# Patient Record
Sex: Female | Born: 1995 | Race: Black or African American | Hispanic: No | Marital: Single
Health system: Midwestern US, Academic
[De-identification: ages and names within clinical notes are randomized; demographics above are authoritative.]

## PROBLEM LIST (undated history)

## (undated) DIAGNOSIS — E282 Polycystic ovarian syndrome: Secondary | ICD-10-CM

## (undated) DIAGNOSIS — E119 Type 2 diabetes mellitus without complications: Secondary | ICD-10-CM

## (undated) DIAGNOSIS — E669 Obesity, unspecified: Secondary | ICD-10-CM

## (undated) DIAGNOSIS — E079 Disorder of thyroid, unspecified: Secondary | ICD-10-CM

---
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
## 2014-06-17 IMAGING — CR DG CERVICAL SPINE COMPLETE 4+V
5 series · 5 of 5 positions shown · non-contrast
Comparison: None.

CLINICAL DATA: Status post fall; neck pain.

CERVICAL SPINE - COMPLETE 4+ VIEW

[w cervical spine lat]
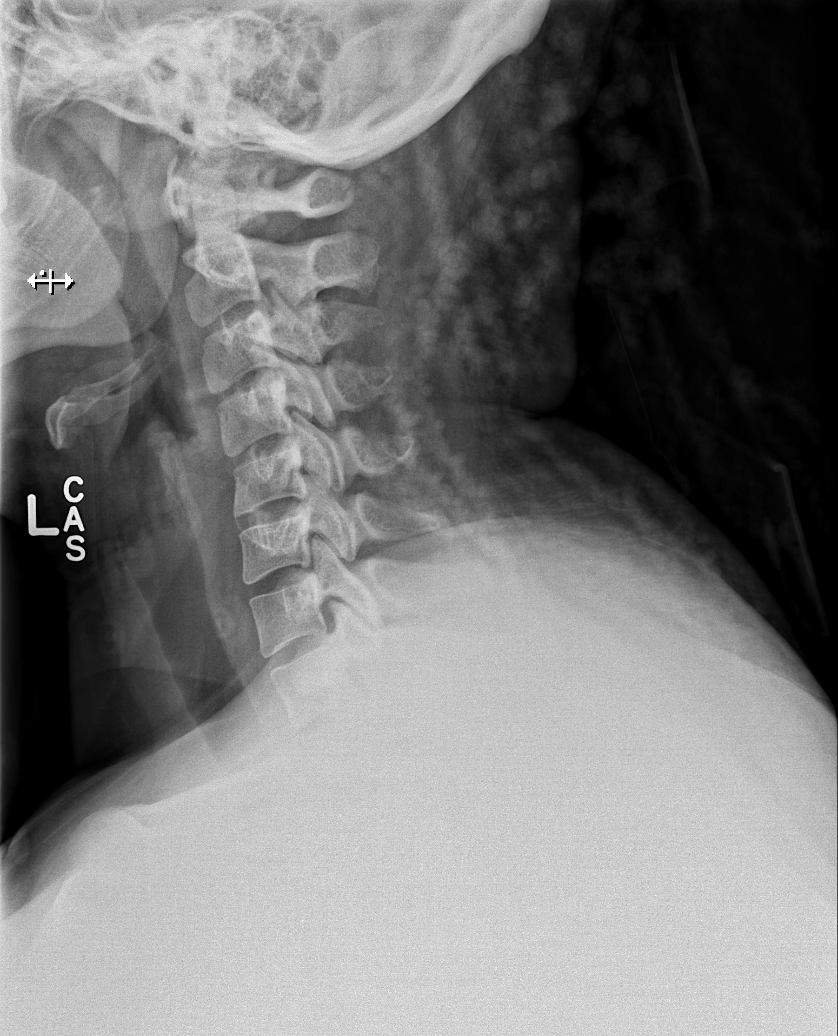

[w cervical spine ap_obl (1 of 2)]
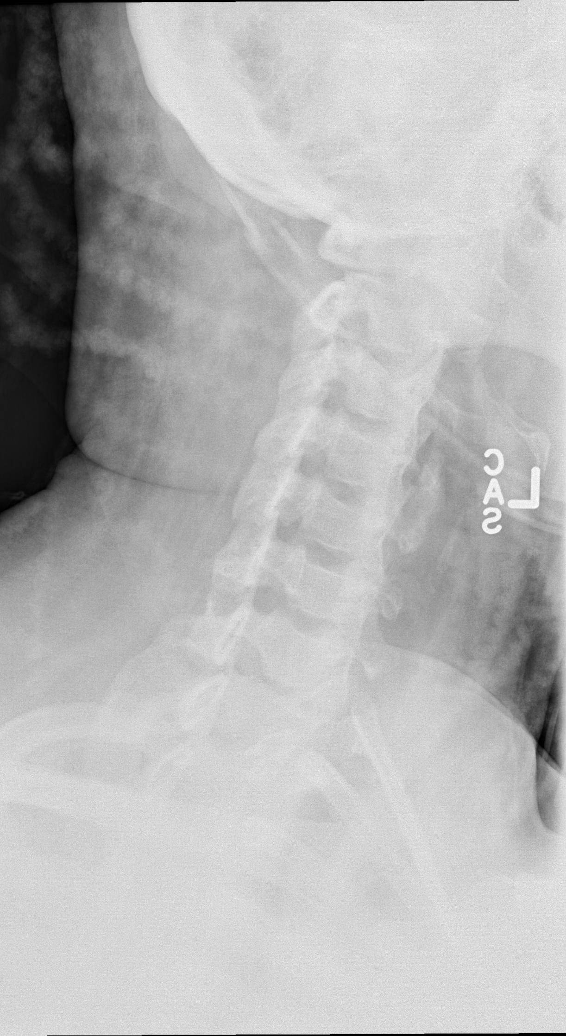

[w cervical spine ap_obl (2 of 2)]
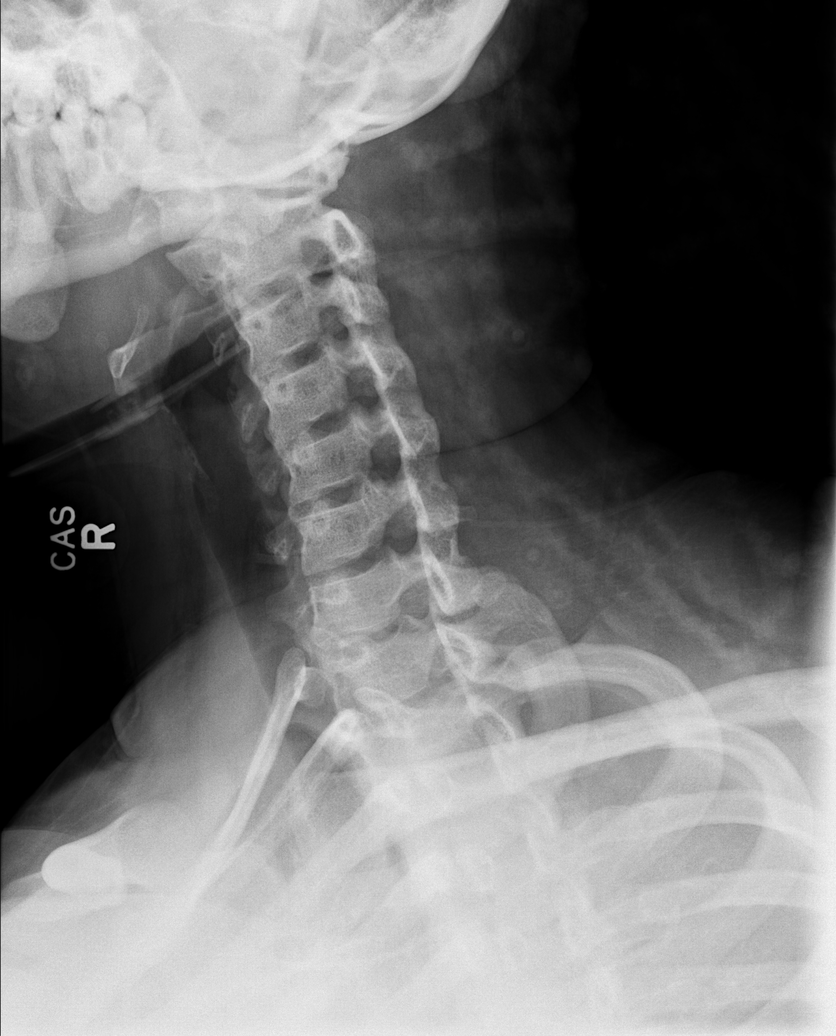

[w cervical spine ap]
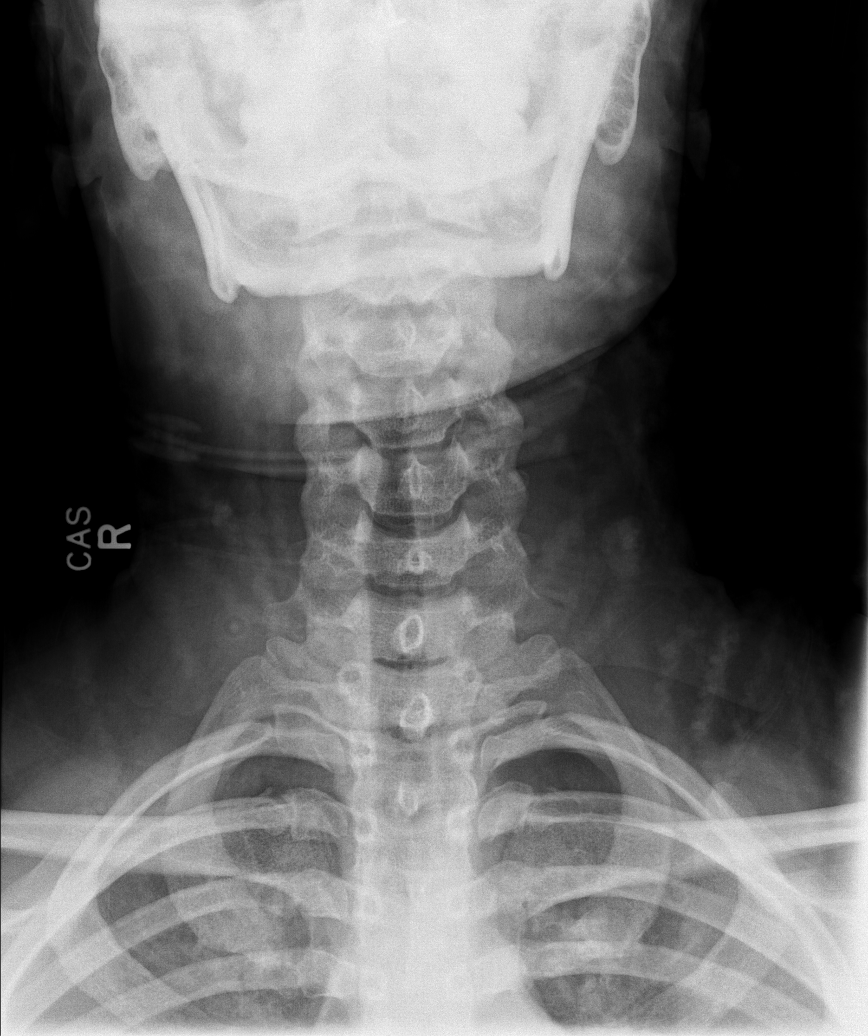

[w cervical swimmers]
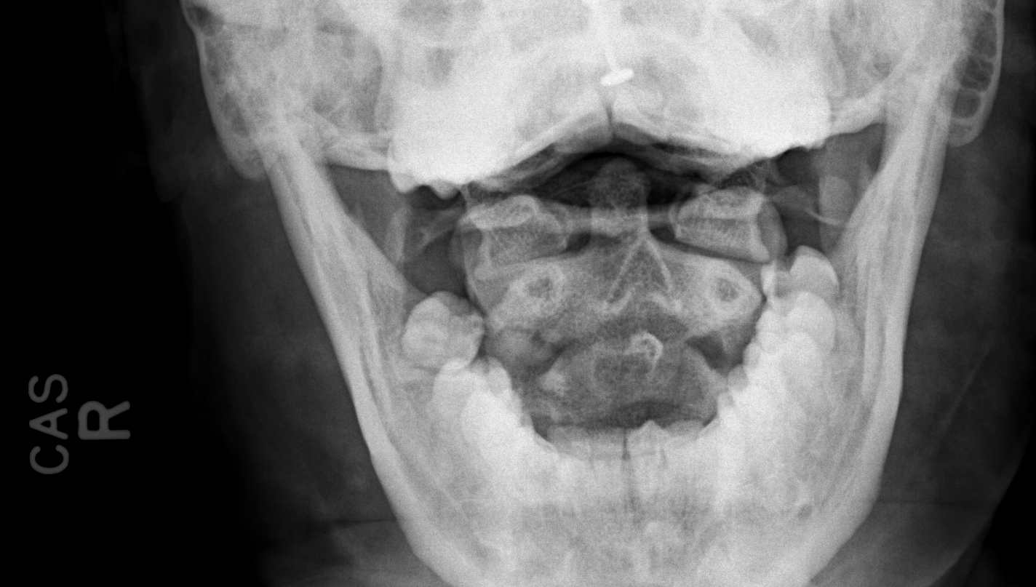

[5 of 5 positions shown; findings below may reference images not displayed]

FINDINGS: There is no evidence of fracture or subluxation.
Vertebral bodies demonstrate normal height and alignment.
Intervertebral disc spaces are preserved.  Prevertebral soft
tissues are within normal limits.  The provided odontoid view
demonstrates no significant abnormality.

The visualized lung apices are clear.
IMPRESSION: No evidence of fracture or subluxation along the cervical spine.

## 2015-07-22 IMAGING — CT CT HEAD W/O CM
2 series · 16 of 30 positions shown, 18 images · non-contrast
Comparison: None.

CLINICAL DATA: Syncope and fall yesterday. Woke up on floor.
Headache.

EXAM:
CT HEAD WITHOUT CONTRAST
TECHNIQUE: Contiguous axial images were obtained from the base of the skull
through the vertex without intravenous contrast.

[Series 201: head w/o, idose (1) · axial · non-contrast · 0.49mm/px · z∈[+63,+183]mm · 8 of 32 slices shown, 10 images]
[im 4/32  brain]
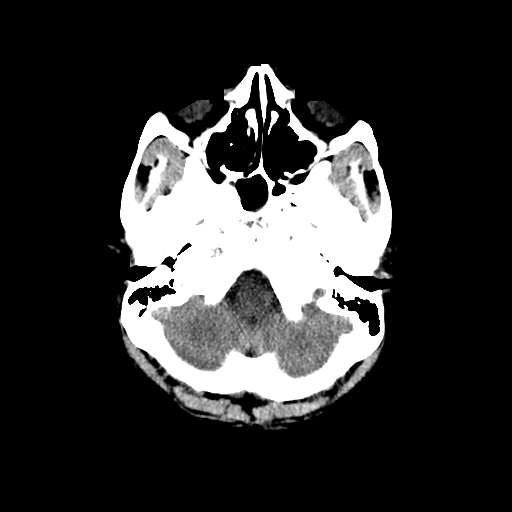
[im 4/32  bone]
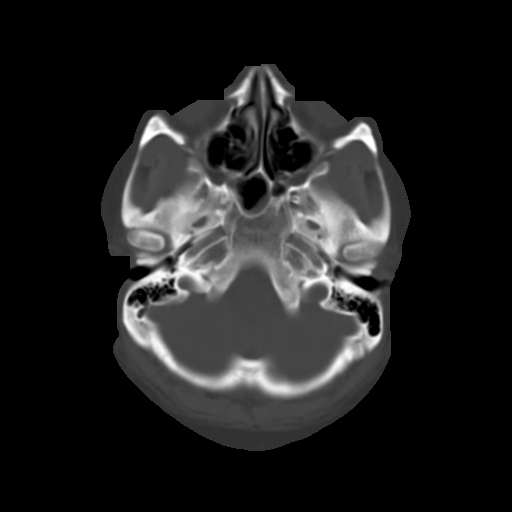
[im 7/32  brain]
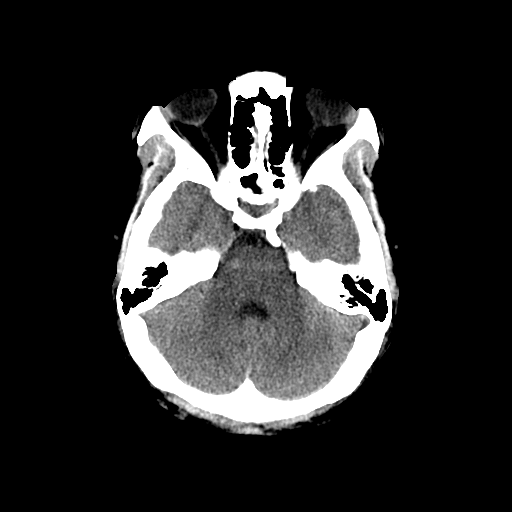
[im 11/32  brain]
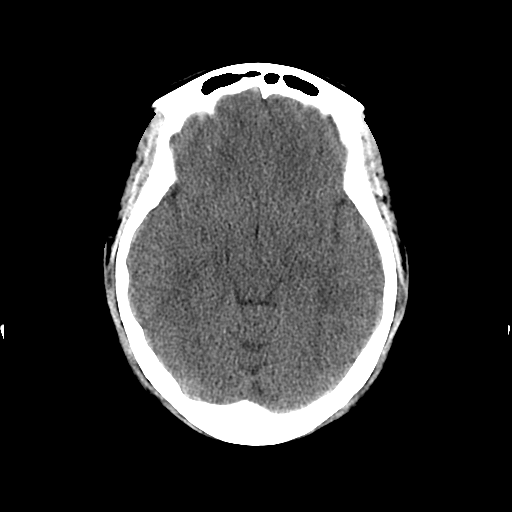
[im 14/32  brain]
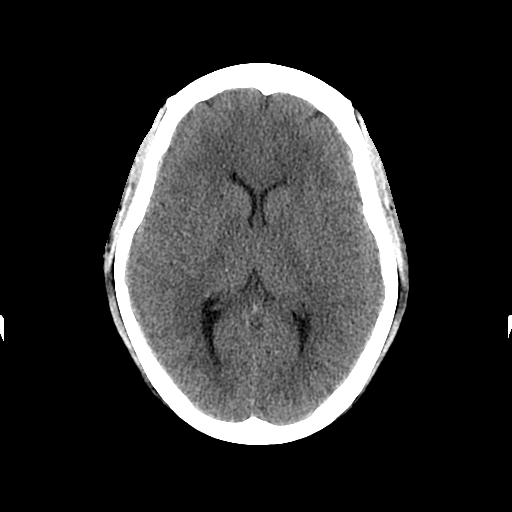
[im 18/32  brain]
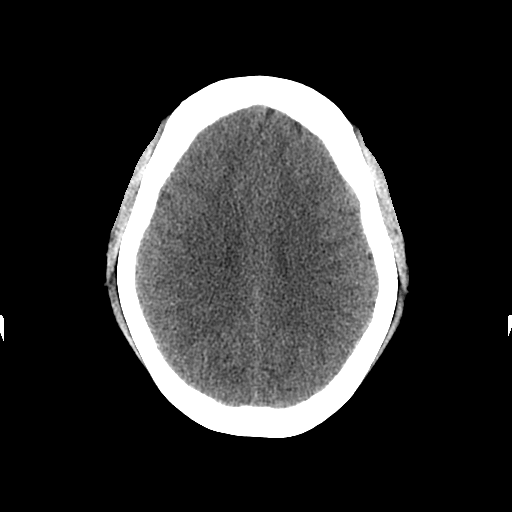
[im 18/32  bone]
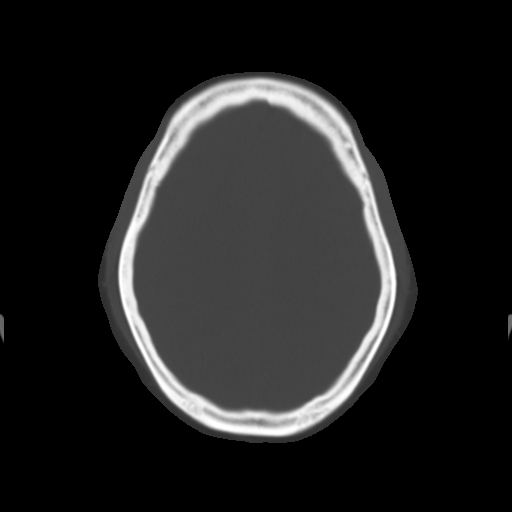
[im 21/32  brain]
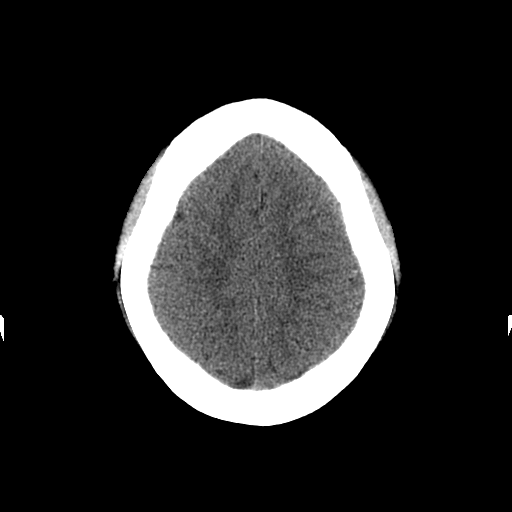
[im 25/32  brain]
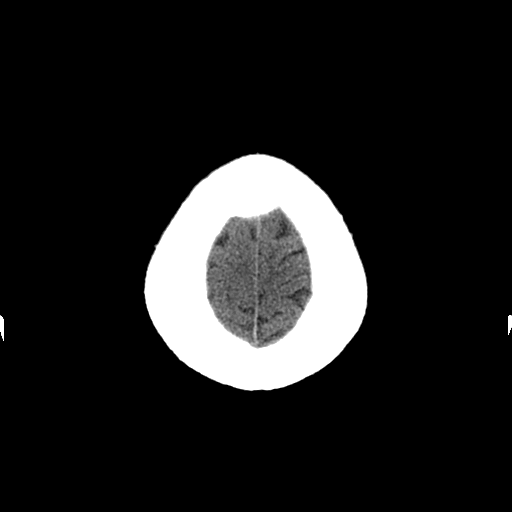
[im 28/32  brain]
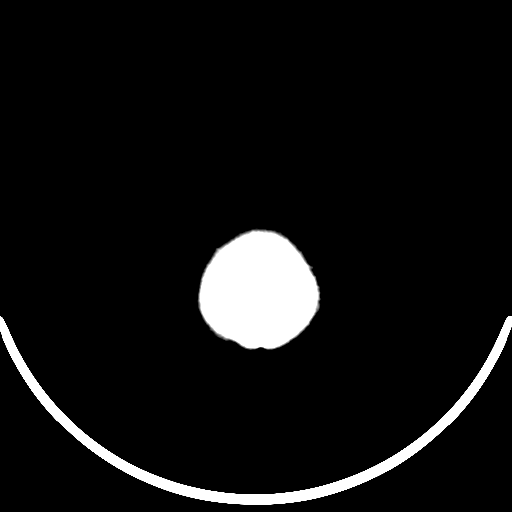

[Series 202: head w/o bone, idose (1) · axial · non-contrast · 0.49mm/px · z∈[+61,+186]mm · 8 of 64 slices shown]
[im 7/64  bone]
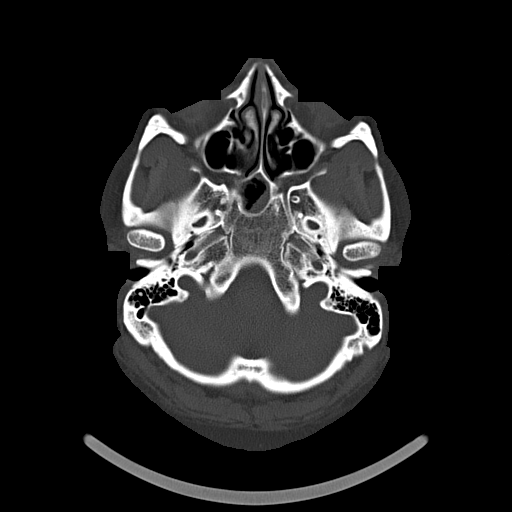
[im 14/64  bone]
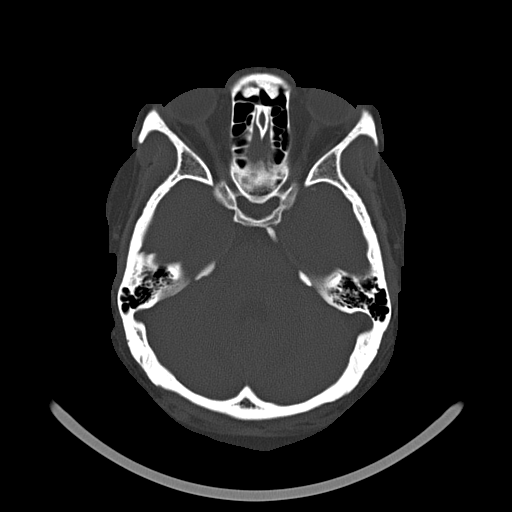
[im 20/64  bone]
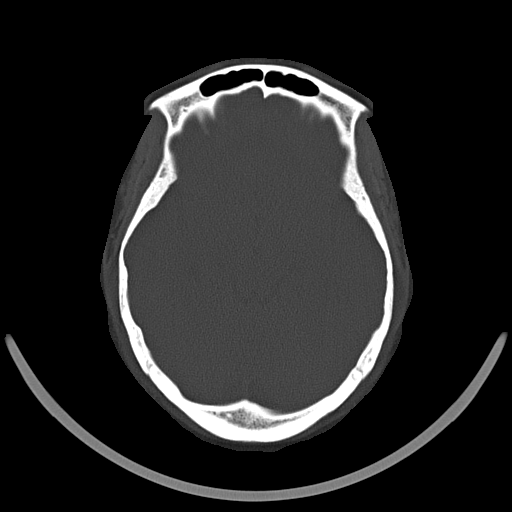
[im 27/64  bone]
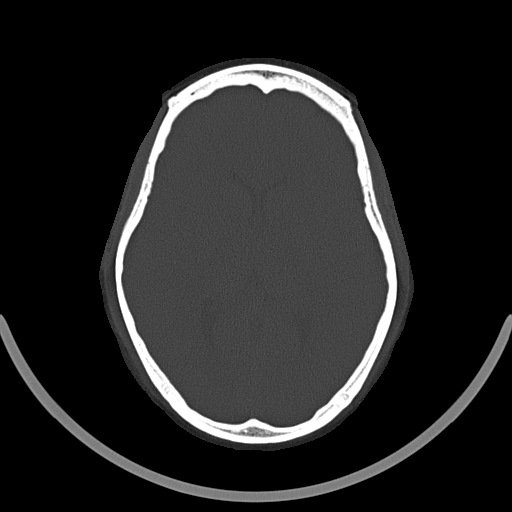
[im 37/64  bone]
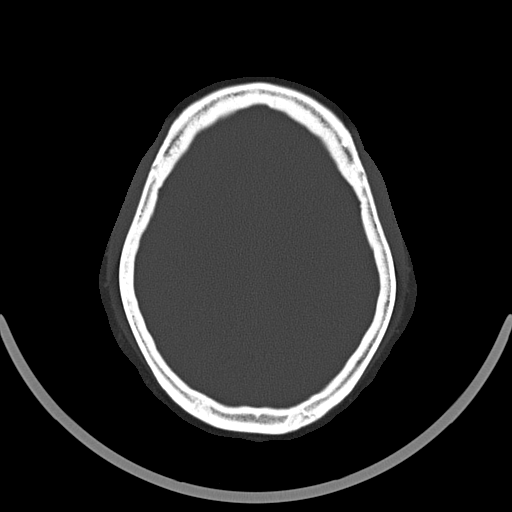
[im 44/64  bone]
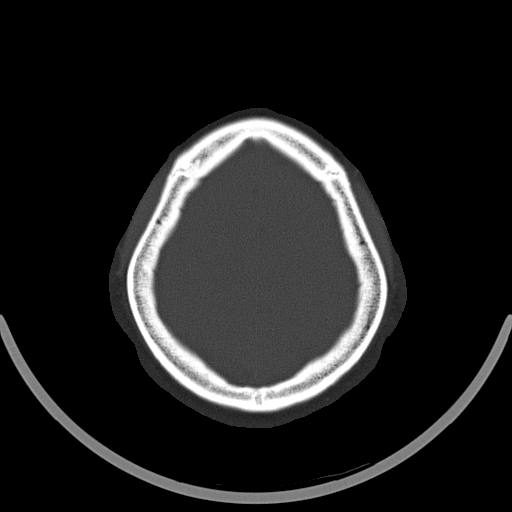
[im 50/64  bone]
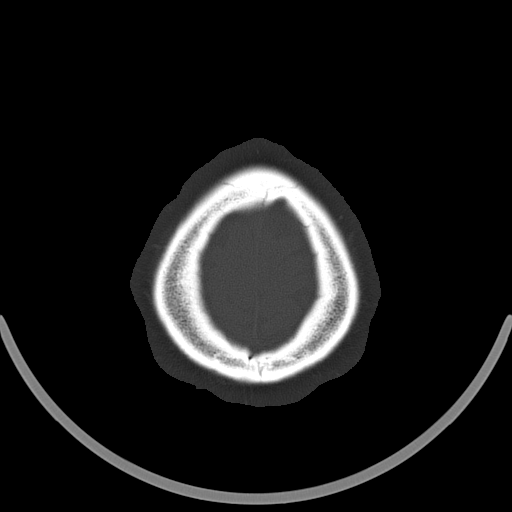
[im 57/64  bone]
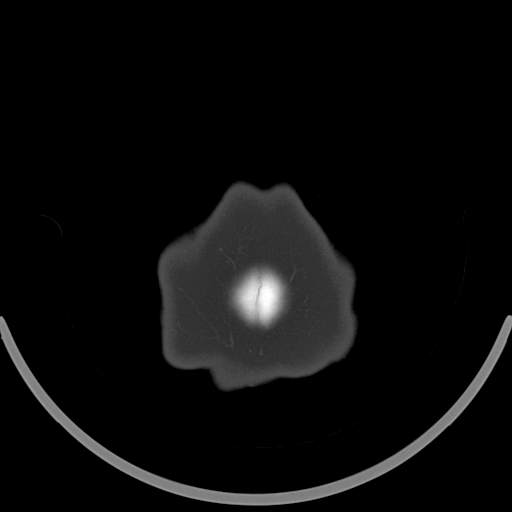

[16 of 30 positions shown; findings below may reference images not displayed]

FINDINGS: The ventricles and sulci are normal. No intraparenchymal hemorrhage,
mass effect nor midline shift. No acute large vascular territory
infarcts.

No abnormal extra-axial fluid collections. Basal cisterns are
patent.

No skull fracture. The included ocular globes and orbital contents
are non-suspicious. Sphenoid sinus mucosal thickening with frothy
secretions, minimal ethmoid mucosal thickening. Mastoid air cells
are well aerated.
IMPRESSION: No acute intracranial process; normal noncontrast CT of the head.

Mild sphenoid ethmoidal sinusitis.

  By: Ai Utama

## 2018-07-14 ENCOUNTER — Encounter (HOSPITAL_COMMUNITY): Payer: Self-pay

## 2018-07-14 ENCOUNTER — Other Ambulatory Visit: Payer: Self-pay

## 2018-07-14 ENCOUNTER — Ambulatory Visit (HOSPITAL_COMMUNITY)
Admission: EM | Admit: 2018-07-14 | Discharge: 2018-07-14 | Disposition: A | Discharge status: Home / Self Care | Payer: BC Managed Care – PPO | Attending: Internal Medicine | Admitting: Internal Medicine

## 2018-07-14 DIAGNOSIS — T7840XA Allergy, unspecified, initial encounter: Secondary | ICD-10-CM

## 2018-07-14 DIAGNOSIS — M545 Low back pain: Secondary | ICD-10-CM

## 2018-07-14 MED ORDER — KETOROLAC TROMETHAMINE 30 MG/ML IJ SOLN: 30.0000 mg | Freq: Once | INTRAMUSCULAR | Status: DC

## 2018-07-14 MED ORDER — HYDROXYZINE HCL 25 MG PO TABS: 25.0000 mg | ORAL_TABLET | Freq: Four times a day (QID) | ORAL | 0 refills | Status: AC | PRN

## 2018-07-14 MED ORDER — DEXAMETHASONE SODIUM PHOSPHATE 10 MG/ML IJ SOLN
10.0000 mg | Freq: Once | INTRAMUSCULAR | Status: AC
  Administered 2018-07-14: 19:00:00 10 mg via INTRAMUSCULAR

## 2018-07-14 MED ORDER — AEROCHAMBER PLUS FLO-VU LARGE MISC
Status: AC
  Filled 2018-07-14: qty 1

## 2018-07-14 MED ORDER — ALBUTEROL SULFATE HFA 108 (90 BASE) MCG/ACT IN AERS
INHALATION_SPRAY | RESPIRATORY_TRACT | Status: AC
  Filled 2018-07-14: qty 6.7

## 2018-07-14 MED ORDER — KETOROLAC TROMETHAMINE 30 MG/ML IJ SOLN
INTRAMUSCULAR | Status: AC
  Filled 2018-07-14: qty 1

## 2018-07-14 MED ORDER — DEXAMETHASONE SODIUM PHOSPHATE 10 MG/ML IJ SOLN
INTRAMUSCULAR | Status: AC
  Filled 2018-07-14: qty 1

## 2018-07-14 MED ORDER — FAMOTIDINE 20 MG PO TABS: 20.0000 mg | ORAL_TABLET | Freq: Two times a day (BID) | ORAL | 0 refills | Status: AC

## 2018-07-14 MED ORDER — KETOROLAC TROMETHAMINE 30 MG/ML IJ SOLN
30.0000 mg | Freq: Once | INTRAMUSCULAR | Status: AC
  Administered 2018-07-14: 19:00:00 30 mg via INTRAMUSCULAR

## 2018-07-14 MED ORDER — ALBUTEROL SULFATE HFA 108 (90 BASE) MCG/ACT IN AERS
2.0000 | INHALATION_SPRAY | Freq: Once | RESPIRATORY_TRACT | Status: AC
  Administered 2018-07-14: 19:00:00 2 via RESPIRATORY_TRACT

## 2018-07-14 MED ORDER — PREDNISONE 10 MG PO TABS: 20.0000 mg | ORAL_TABLET | Freq: Every day | ORAL | 0 refills | Status: AC

## 2018-07-14 NOTE — ED Provider Notes (Signed)
[facility]    CSN: [id number] Arrival date & time: 07/14/18  1752     History   Chief Complaint Chief Complaint  Patient presents with  . Urticaria    HPI Joan Castro is a 23 y.o. female with a history of PCOS, asthma and morbid obesity comes to urgent care with 1 day history of urticarial rash which was generalized.  Patient has no knowledge of a triggering event.  This is the second episode of allergic reaction that she has had in the past year or 2.  She denies eating anything unusual or taking any medications.  She had some shortness of breath yesterday which has partially improved over the course of today.  She has no known food or drug allergies.  Patient also has mid back pain which is sharp and constant.  It is severe.  This is associated with her menstrual periods.  She is currently on day #2 of her menstrual period.   Past Medical History:  Diagnosis Date  . Diabetes mellitus without complication (HCC)   . Obesity   . PCOS (polycystic ovarian syndrome)   . Thyroid disease     There are no active problems to display for this patient.   History reviewed. No pertinent surgical history.  OB History   No obstetric history on file.      Home Medications    Prior to Admission medications   Medication Sig Start Date End Date Taking? Authorizing Provider  Cholecalciferol (VITAMIN D PO) Take 2 tablets by mouth daily.    [provider]  famotidine (PEPCID) 20 MG tablet Take 1 tablet (20 mg total) by mouth 2 (two) times daily for 3 days. 07/14/18 07/17/18  Merrilee Jansky, MD  hydrOXYzine (ATARAX/VISTARIL) 25 MG tablet Take 1 tablet (25 mg total) by mouth every 6 (six) hours as needed for up to 3 days for itching. 07/14/18 07/17/18  Amin Fornwalt, Britta Mccreedy, MD  ibuprofen (ADVIL,MOTRIN) 200 MG tablet Take 200 mg by mouth every 6 (six) hours as needed.    [provider]  LEVOTHYROXINE SODIUM PO Take 50 mcg by mouth daily.     [provider]  metFORMIN (GLUCOPHAGE) 1000 MG tablet Take 1,000 mg by mouth 2 (two) times daily with a meal.    [provider]  norgestimate-ethinyl estradiol (ORTHO-CYCLEN,SPRINTEC,PREVIFEM) 0.25-35 MG-MCG tablet Take 1 tablet by mouth daily.    [provider]  predniSONE (DELTASONE) 10 MG tablet Take 2 tablets (20 mg total) by mouth daily for 3 days. 07/14/18 07/17/18  Merrilee Jansky, MD    Family History History reviewed. No pertinent family history.  Social History Social History   Tobacco Use  . Smoking status: Never Smoker  . Smokeless tobacco: Never Used  Substance Use Topics  . Alcohol use: No  . Drug use: Not on file     Allergies   Patient has no known allergies.   Review of Systems Review of Systems  Constitutional: Negative for activity change, appetite change, chills, fatigue and fever.  HENT: Negative for congestion, ear discharge, ear pain, postnasal drip, rhinorrhea, sinus pressure, sinus pain, sneezing, sore throat and trouble swallowing.   Respiratory: Positive for shortness of breath and wheezing. Negative for choking and chest tightness.   Cardiovascular: Negative for chest pain and palpitations.  Gastrointestinal: Negative for abdominal distention, abdominal pain, diarrhea, nausea and vomiting.  Genitourinary: Positive for vaginal bleeding. Negative for dysuria, frequency and urgency.  Musculoskeletal: Positive for back pain. Negative  for joint swelling, myalgias and neck pain.  Skin: Negative for rash and wound.  Neurological: Negative for dizziness, weakness, numbness and headaches.     Physical Exam Triage Vital Signs ED Triage Vitals  Enc Vitals Group     BP 07/14/18 1840 (!) 148/100     Pulse Rate 07/14/18 1812 (!) 136     Resp 07/14/18 1812 18     Temp 07/14/18 1812 98.9 F (37.2 C)     Temp Source 07/14/18 1812 Oral     SpO2 07/14/18 1812 98 %     Weight 07/14/18 1816 (!) 330 lb (149.7 kg)     Height --      Head  Circumference --      Peak Flow --      Pain Score 07/14/18 1816 5     Pain Loc --      Pain Edu? --      Excl. in GC? --    No data found.  Updated Vital Signs BP 139/74 (BP Location: Right Arm) Comment (BP Location): regular cuff to right forearm  Pulse (!) 129   Temp 98.9 F (37.2 C) (Oral)   Resp 18   Wt (!) 149.7 kg   LMP 07/14/2018   SpO2 98%   Visual Acuity Right Eye Distance:   Left Eye Distance:   Bilateral Distance:    Right Eye Near:   Left Eye Near:    Bilateral Near:     Physical Exam Constitutional:      General: She is in acute distress.     Appearance: She is ill-appearing.  HENT:     Right Ear: Tympanic membrane normal.     Left Ear: Tympanic membrane normal.     Nose: Nose normal.     Mouth/Throat:     Mouth: Mucous membranes are moist.     Pharynx: No posterior oropharyngeal erythema.     Comments: Tongue is not swollen.  No lip swelling.  Patient airway is not compromised. Eyes:     Conjunctiva/sclera: Conjunctivae normal.  Neck:     Musculoskeletal: Normal range of motion.  Cardiovascular:     Rate and Rhythm: Regular rhythm. Tachycardia present.  Pulmonary:     Effort: Respiratory distress present.     Breath sounds: No stridor. No wheezing, rhonchi or rales.  Chest:     Chest wall: No tenderness.  Abdominal:     General: Bowel sounds are normal.     Palpations: Abdomen is soft.  Musculoskeletal:        General: No swelling, tenderness or deformity.  Skin:    General: Skin is warm.     Capillary Refill: Capillary refill takes less than 2 seconds.     Findings: Rash present.     Comments: Generalized urticarial rash  Neurological:     General: No focal deficit present.     Mental Status: She is alert.      UC Treatments / Results  Labs (all labs ordered are listed, but only abnormal results are displayed) Labs Reviewed - No data to display  EKG None  Radiology No results found.  Procedures Procedures (including  critical care time)  Medications Ordered in UC Medications  ketorolac (TORADOL) 30 MG/ML injection 30 mg (has no administration in time range)  albuterol (VENTOLIN HFA) 108 (90 Base) MCG/ACT inhaler 2 puff (2 puffs Inhalation Given 07/14/18 1851)  dexamethasone (DECADRON) injection 10 mg (10 mg Intramuscular Given 07/14/18 1851)    Initial Impression /  Assessment and Plan / UC Course  I have reviewed the triage vital signs and the nursing notes.  Pertinent labs & imaging results that were available during my care of the patient were reviewed by me and considered in my medical decision making (see chart for details).     1.  Acute allergic reaction: Dexamethasone 10 mg IM Prednisone 20 mg orally daily x3 days Vistaril 25 mg every 8 hours as needed for itching Famotidine 20 mg orally twice daily for 3 days  2.  Back pain associated with menstrual period: Toradol 10 mg IM  At the time of patient being seen in the urgent care patient looked acutely ill in distress.  After patient was given a butyryl inhaler with a spacer, IM dexamethasone and IM Toradol, patient felt better.  Blood pressure and heart rate improved.  Heart rate was 136 initially.  This improved to 117 after she was given a dose of Toradol and had albuterol inhaler.  Patient feels better and will be discharged home from urgent care.  She is advised to go to ED if her respiratory symptoms worsen.   Final Clinical Impressions(s) / UC Diagnoses   Final diagnoses:  None   Discharge Instructions   None    ED Prescriptions    Medication Sig Dispense Auth. Provider   hydrOXYzine (ATARAX/VISTARIL) 25 MG tablet Take 1 tablet (25 mg total) by mouth every 6 (six) hours as needed for up to 3 days for itching. 12 tablet Mildreth Reek, Britta Mccreedy, MD   famotidine (PEPCID) 20 MG tablet Take 1 tablet (20 mg total) by mouth 2 (two) times daily for 3 days. 30 tablet Vishnu Moeller, Britta Mccreedy, MD   predniSONE (DELTASONE) 10 MG tablet Take 2 tablets (20  mg total) by mouth daily for 3 days. 6 tablet Demarious Kapur, Britta Mccreedy, MD     Controlled Substance Prescriptions [place] Controlled Substance Registry consulted? No   Merrilee Jansky, MD 07/14/18 [phone]

## 2018-07-14 NOTE — ED Triage Notes (Signed)
Pt states she has hives this started last night. Pt states she's getting SOB.pt states she is having sarp pains down her back.

## 2020-11-15 ENCOUNTER — Encounter: Admit: 2020-11-15 | Discharge: 2020-11-15

## 2020-11-15 LAB — URINALYSIS DIPSTICK REFLEX TO CULTURE
GLUCOSE,UA: NEGATIVE
LEUKOCYTES: NEGATIVE
NITRITE: NEGATIVE
URINE BILE: NEGATIVE
URINE KETONE: NEGATIVE

## 2020-11-15 LAB — URINALYSIS MICROSCOPIC REFLEX TO CULTURE

## 2020-11-15 MED ORDER — ENOXAPARIN 40 MG/0.4 ML SC SYRG
40 mg | Freq: Every day | SUBCUTANEOUS | 0 refills | Status: AC
Start: 2020-11-15 — End: ?
  Administered 2020-11-16: 08:00:00 40 mg via SUBCUTANEOUS

## 2020-11-15 MED ORDER — ACETAMINOPHEN 325 MG PO TAB
650 mg | ORAL | 0 refills | Status: AC | PRN
Start: 2020-11-15 — End: ?
  Administered 2020-11-16 – 2020-11-19 (×4): 650 mg via ORAL

## 2020-11-16 ENCOUNTER — Encounter: Admit: 2020-11-16 | Discharge: 2020-11-16 | Payer: BC Managed Care – PPO

## 2020-11-16 ENCOUNTER — Inpatient Hospital Stay: Admit: 2020-11-16

## 2020-11-16 ENCOUNTER — Inpatient Hospital Stay: Admit: 2020-11-16 | Discharge: 2020-11-16 | Payer: BC Managed Care – PPO

## 2020-11-16 MED ADMIN — FENTANYL CITRATE (PF) 50 MCG/ML IJ SOLN [3037]: 50 ug | INTRAVENOUS | @ 16:00:00 | Stop: 2020-11-16 | NDC 00409909425

## 2020-11-16 MED ADMIN — LEVOTHYROXINE 50 MCG PO TAB [4421]: 75 ug | ORAL | @ 11:00:00 | NDC 00904695061

## 2020-11-16 MED ADMIN — HYDROXYUREA 500 MG PO CAP [10236]: 2000 mg | ORAL | @ 17:00:00 | NDC 68084028411

## 2020-11-16 MED ADMIN — MIDAZOLAM 1 MG/ML IJ SOLN [10607]: 1 mg | INTRAVENOUS | @ 16:00:00 | Stop: 2020-11-16 | NDC 00409230516

## 2020-11-16 MED ADMIN — HYDROXYUREA 500 MG PO CAP [10236]: 6000 mg | ORAL | @ 08:00:00 | Stop: 2020-11-16 | NDC 68084028411

## 2020-11-16 MED ADMIN — DIPHENHYDRAMINE HCL 50 MG/ML IJ SOLN [2508]: 50 mg | INTRAVENOUS | @ 16:00:00 | Stop: 2020-11-16 | NDC 00641037621

## 2020-11-16 MED ADMIN — ALLOPURINOL 300 MG PO TAB [311]: 600 mg | ORAL | @ 08:00:00 | Stop: 2020-11-16 | NDC 00904657261

## 2020-11-16 MED ADMIN — ALLOPURINOL 300 MG PO TAB [311]: 300 mg | ORAL | @ 20:00:00 | NDC 00904657261

## 2020-11-16 MED ADMIN — LEVOTHYROXINE 25 MCG PO TAB [4420]: 75 ug | ORAL | @ 11:00:00 | NDC 00904694961

## 2020-11-16 MED ADMIN — MELOXICAM 15 MG PO TAB [78167]: 15 mg | ORAL | @ 20:00:00 | NDC 60687019911

## 2020-11-16 MED ADMIN — MAGNESIUM SULFATE IN D5W 1 GRAM/100 ML IV PGBK [166578]: 1 g | INTRAVENOUS | @ 09:00:00 | Stop: 2020-11-16 | NDC 00409672723

## 2020-11-16 MED ADMIN — POTASSIUM CHLORIDE 20 MEQ PO TBTQ [35943]: 40 meq | ORAL | @ 08:00:00 | Stop: 2020-11-16 | NDC 00245531989

## 2020-11-16 MED ADMIN — PANTOPRAZOLE 40 MG PO TBEC [80436]: 40 mg | ORAL | @ 08:00:00 | NDC 00904647461

## 2020-11-16 MED ADMIN — SODIUM CHLORIDE 0.9 % IV SOLP [27838]: 250 mL | INTRAVENOUS | @ 09:00:00 | Stop: 2020-11-16 | NDC 00338004902

## 2020-11-16 MED ADMIN — PERFLUTREN LIPID MICROSPHERES 1.1 MG/ML IV SUSP [79178]: 1 mL | INTRAVENOUS | @ 14:00:00 | Stop: 2020-11-16 | NDC 11994001116

## 2020-11-17 ENCOUNTER — Encounter: Admit: 2020-11-17 | Discharge: 2020-11-17 | Payer: BC Managed Care – PPO

## 2020-11-17 ENCOUNTER — Inpatient Hospital Stay: Admit: 2020-11-17 | Discharge: 2020-11-17 | Payer: BC Managed Care – PPO

## 2020-11-17 MED ADMIN — GADOBENATE DIMEGLUMINE 529 MG/ML (0.1MMOL/0.2ML) IV SOLN [135881]: 20 mL | INTRAVENOUS | @ 13:00:00 | Stop: 2020-11-17 | NDC 00270516415

## 2020-11-17 MED ADMIN — ALLOPURINOL 300 MG PO TAB [311]: 300 mg | ORAL | @ 14:00:00 | NDC 00904657261

## 2020-11-17 MED ADMIN — HYDROXYUREA 500 MG PO CAP [10236]: 2000 mg | ORAL | @ 18:00:00 | NDC 68084028411

## 2020-11-17 MED ADMIN — HYDROXYUREA 500 MG PO CAP [10236]: 2000 mg | ORAL | @ 11:00:00 | NDC 68084028411

## 2020-11-17 MED ADMIN — POTASSIUM CHLORIDE 20 MEQ PO TBTQ [35943]: 40 meq | ORAL | @ 14:00:00 | Stop: 2020-11-17 | NDC 00245531989

## 2020-11-17 MED ADMIN — HYDROXYUREA 500 MG PO CAP [10236]: 2000 mg | ORAL | @ 03:00:00 | NDC 68084028411

## 2020-11-17 MED ADMIN — MELOXICAM 15 MG PO TAB [78167]: 15 mg | ORAL | @ 14:00:00 | NDC 60687019911

## 2020-11-17 MED ADMIN — CYCLOBENZAPRINE 10 MG PO TAB [2017]: 10 mg | ORAL | @ 01:00:00 | NDC 69097084615

## 2020-11-17 MED ADMIN — LEVOTHYROXINE 25 MCG PO TAB [4420]: 75 ug | ORAL | @ 11:00:00 | NDC 00904694961

## 2020-11-17 MED ADMIN — PANTOPRAZOLE 40 MG PO TBEC [80436]: 40 mg | ORAL | @ 01:00:00 | NDC 00904647461

## 2020-11-17 MED ADMIN — LEVOTHYROXINE 50 MCG PO TAB [4421]: 75 ug | ORAL | @ 11:00:00 | NDC 00904695061

## 2020-11-17 MED ADMIN — MONTELUKAST 10 MG PO TAB [81627]: 10 mg | ORAL | @ 01:00:00 | NDC 00904680861

## 2020-11-18 ENCOUNTER — Inpatient Hospital Stay: Admit: 2020-11-18 | Discharge: 2020-11-18 | Payer: BC Managed Care – PPO

## 2020-11-18 ENCOUNTER — Encounter: Admit: 2020-11-18 | Discharge: 2020-11-18 | Payer: BC Managed Care – PPO

## 2020-11-18 DIAGNOSIS — C9500 Acute leukemia of unspecified cell type not having achieved remission: Secondary | ICD-10-CM

## 2020-11-18 MED ADMIN — MELOXICAM 15 MG PO TAB [78167]: 15 mg | ORAL | @ 15:00:00 | NDC 60687019911

## 2020-11-18 MED ADMIN — PANTOPRAZOLE 40 MG PO TBEC [80436]: 40 mg | ORAL | @ 02:00:00 | NDC 00904647461

## 2020-11-18 MED ADMIN — CYCLOBENZAPRINE 10 MG PO TAB [2017]: 10 mg | ORAL | @ 02:00:00 | NDC 69097084615

## 2020-11-18 MED ADMIN — LEVOTHYROXINE 25 MCG PO TAB [4420]: 75 ug | ORAL | @ 12:00:00 | NDC 00904694961

## 2020-11-18 MED ADMIN — EUCALYPTUS-MENTHOL MM LOZG [83613]: 1 | ORAL | @ 11:00:00 | NDC 12546062970

## 2020-11-18 MED ADMIN — ALLOPURINOL 300 MG PO TAB [311]: 300 mg | ORAL | @ 15:00:00 | NDC 00904657261

## 2020-11-18 MED ADMIN — MAGNESIUM SULFATE IN D5W 1 GRAM/100 ML IV PGBK [166578]: 1 g | INTRAVENOUS | @ 15:00:00 | Stop: 2020-11-18 | NDC 00409672723

## 2020-11-18 MED ADMIN — HYDROXYUREA 500 MG PO CAP [10236]: 2000 mg | ORAL | @ 11:00:00 | NDC 68084028411

## 2020-11-18 MED ADMIN — DIPHENHYDRAMINE HCL 25 MG PO CAP [2509]: 25 mg | ORAL | @ 11:00:00 | Stop: 2020-11-18 | NDC 00904530661

## 2020-11-18 MED ADMIN — HYDROXYUREA 500 MG PO CAP [10236]: 2000 mg | ORAL | @ 19:00:00 | NDC 68084028411

## 2020-11-18 MED ADMIN — MONTELUKAST 10 MG PO TAB [81627]: 10 mg | ORAL | @ 02:00:00 | NDC 00904680861

## 2020-11-18 MED ADMIN — HYDROXYUREA 500 MG PO CAP [10236]: 2000 mg | ORAL | @ 02:00:00 | NDC 68084028411

## 2020-11-18 MED ADMIN — LEVOTHYROXINE 50 MCG PO TAB [4421]: 75 ug | ORAL | @ 12:00:00 | NDC 00904695061

## 2020-11-18 MED ADMIN — ACETAZOLAMIDE 250 MG PO TAB [113]: 500 mg | ORAL | @ 17:00:00 | NDC 00904666361

## 2020-11-18 MED ADMIN — POTASSIUM CHLORIDE 20 MEQ PO TBTQ [35943]: 40 meq | ORAL | @ 15:00:00 | Stop: 2020-11-18 | NDC 00245531989

## 2020-11-18 NOTE — Telephone Encounter
Requested records from [facility].

## 2020-11-19 ENCOUNTER — Encounter: Admit: 2020-11-19 | Discharge: 2020-11-19 | Payer: BC Managed Care – PPO

## 2020-11-19 DIAGNOSIS — C9590 Leukemia, unspecified not having achieved remission: Secondary | ICD-10-CM

## 2020-11-19 DIAGNOSIS — C9210 Chronic myeloid leukemia, BCR/ABL-positive, not having achieved remission: Secondary | ICD-10-CM

## 2020-11-19 MED ORDER — HYDROXYUREA 500 MG PO CAP
1000 mg | ORAL_CAPSULE | Freq: Every day | ORAL | 0 refills | 30.00000 days | Status: AC
Start: 2020-11-19 — End: ?
  Filled 2020-11-19: qty 6, 3d supply, fill #1

## 2020-11-19 MED ADMIN — ACETAZOLAMIDE 250 MG PO TAB [113]: 500 mg | ORAL | @ 02:00:00 | NDC 00904666361

## 2020-11-19 MED ADMIN — PANTOPRAZOLE 40 MG PO TBEC [80436]: 40 mg | ORAL | @ 02:00:00 | NDC 00904647461

## 2020-11-19 MED ADMIN — ONDANSETRON HCL (PF) 4 MG/2 ML IJ SOLN [136012]: 4 mg | INTRAVENOUS | @ 18:00:00 | Stop: 2020-11-20 | NDC 36000001225

## 2020-11-19 MED ADMIN — LEVOTHYROXINE 25 MCG PO TAB [4420]: 75 ug | ORAL | @ 14:00:00 | Stop: 2020-11-20 | NDC 00904694961

## 2020-11-19 MED ADMIN — MELOXICAM 15 MG PO TAB [78167]: 15 mg | ORAL | @ 14:00:00 | Stop: 2020-11-20 | NDC 60687019911

## 2020-11-19 MED ADMIN — ACETAZOLAMIDE 250 MG PO TAB [113]: 500 mg | ORAL | @ 14:00:00 | Stop: 2020-11-20 | NDC 00904666361

## 2020-11-19 MED ADMIN — CYCLOBENZAPRINE 10 MG PO TAB [2017]: 10 mg | ORAL | @ 04:00:00 | NDC 69097084615

## 2020-11-19 MED ADMIN — HYDROXYUREA 500 MG PO CAP [10236]: 2000 mg | ORAL | @ 11:00:00 | Stop: 2020-11-20 | NDC 68084028411

## 2020-11-19 MED ADMIN — MONTELUKAST 10 MG PO TAB [81627]: 10 mg | ORAL | @ 02:00:00 | NDC 00904680861

## 2020-11-19 MED ADMIN — ONDANSETRON HCL (PF) 4 MG/2 ML IJ SOLN [136012]: 4 mg | INTRAVENOUS | @ 03:00:00 | Stop: 2020-11-19 | NDC 36000001225

## 2020-11-19 MED ADMIN — HYDROXYUREA 500 MG PO CAP [10236]: 2000 mg | ORAL | @ 02:00:00 | NDC 68084028411

## 2020-11-19 MED ADMIN — ALLOPURINOL 300 MG PO TAB [311]: 300 mg | ORAL | @ 14:00:00 | Stop: 2020-11-20 | NDC 00904657261

## 2020-11-19 MED ADMIN — HYDROXYUREA 500 MG PO CAP [10236]: 2000 mg | ORAL | @ 18:00:00 | Stop: 2020-11-20 | NDC 68084028411

## 2020-11-19 MED ADMIN — LEVOTHYROXINE 50 MCG PO TAB [4421]: 75 ug | ORAL | @ 14:00:00 | Stop: 2020-11-20 | NDC 00904695061

## 2020-11-19 MED FILL — ACETAZOLAMIDE 250 MG PO TAB: 250 mg | ORAL | 30 days supply | Qty: 120 | Fill #1 | Status: CP

## 2020-11-19 MED FILL — ALLOPURINOL 300 MG PO TAB: 300 mg | ORAL | 14 days supply | Qty: 14 | Fill #1 | Status: CP

## 2020-11-20 ENCOUNTER — Encounter: Admit: 2020-11-20 | Discharge: 2020-11-20 | Payer: BC Managed Care – PPO

## 2020-11-21 ENCOUNTER — Encounter: Admit: 2020-11-21 | Discharge: 2020-11-21 | Payer: BC Managed Care – PPO

## 2020-11-29 ENCOUNTER — Encounter: Admit: 2020-11-29 | Discharge: 2020-11-29 | Payer: BC Managed Care – PPO

## 2020-11-29 DIAGNOSIS — G932 Benign intracranial hypertension: Secondary | ICD-10-CM

## 2021-01-24 ENCOUNTER — Encounter: Admit: 2021-01-24 | Discharge: 2021-01-24 | Payer: BC Managed Care – PPO

## 2021-09-27 ENCOUNTER — Encounter: Admit: 2021-09-27 | Discharge: 2021-09-27 | Payer: BC Managed Care – PPO

## 2021-10-03 ENCOUNTER — Encounter: Admit: 2021-10-03 | Discharge: 2021-10-03 | Payer: BC Managed Care – PPO

## 2021-10-03 NOTE — Telephone Encounter
Butch Penny called from Dr. Lysle Dingwall office and Keala Drum has no-showed her last two appointments with Dr. Lanell Matar.  She wanted Dr. Tammi Klippel to know that unfortunately they will not be able to see/reschedule this patient.  Routing to Dr. Tammi Klippel so he is aware.

## 2021-10-07 ENCOUNTER — Encounter: Admit: 2021-10-07 | Discharge: 2021-10-07 | Payer: BC Managed Care – PPO

## 2021-10-28 ENCOUNTER — Encounter: Admit: 2021-10-28 | Discharge: 2021-10-28 | Payer: BC Managed Care – PPO

## 2021-11-06 ENCOUNTER — Encounter: Admit: 2021-11-06 | Discharge: 2021-11-06 | Payer: MEDICAID

## 2022-01-07 ENCOUNTER — Encounter: Admit: 2022-01-07 | Discharge: 2022-01-07 | Payer: MEDICAID

## 2022-05-21 ENCOUNTER — Encounter: Admit: 2022-05-21 | Discharge: 2022-05-21 | Payer: MEDICAID

## 2023-04-29 ENCOUNTER — Encounter: Admit: 2023-04-29 | Discharge: 2023-04-29 | Payer: MEDICAID

## 2023-07-05 ENCOUNTER — Encounter: Admit: 2023-07-05 | Discharge: 2023-07-05 | Payer: MEDICAID
